# Patient Record
Sex: Male | Born: 2000 | Race: White | Hispanic: No | Marital: Single | Smoking: Never smoker
Health system: Southern US, Community
[De-identification: ages and names within clinical notes are randomized; demographics above are authoritative.]

## PROBLEM LIST (undated history)

## (undated) DIAGNOSIS — F988 Other specified behavioral and emotional disorders with onset usually occurring in childhood and adolescence: Secondary | ICD-10-CM

---
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
## 2019-03-02 ENCOUNTER — Encounter: Payer: Self-pay | Admitting: Cardiology

## 2019-03-17 ENCOUNTER — Telehealth (INDEPENDENT_AMBULATORY_CARE_PROVIDER_SITE_OTHER): Payer: 59 | Admitting: Cardiology

## 2019-03-17 DIAGNOSIS — Z8616 Personal history of COVID-19: Secondary | ICD-10-CM

## 2019-03-17 DIAGNOSIS — Z8619 Personal history of other infectious and parasitic diseases: Secondary | ICD-10-CM | POA: Diagnosis not present

## 2019-03-17 NOTE — Patient Instructions (Signed)
Medication Instructions:  Your Physician recommend you continue on your current medication as directed.    If you need a refill on your cardiac medications before your next appointment, please call your pharmacy.   Lab work: None  Testing/Procedures: None  Follow-Up: Your physician recommends that you schedule a follow-up appointment as needed with Dr. Christopher.     

## 2019-03-17 NOTE — Progress Notes (Signed)
Virtual Visit via Video Note   This visit type was conducted due to national recommendations for restrictions regarding the COVID-19 Pandemic (e.g. social distancing) in an effort to limit this patient's exposure and mitigate transmission in our community.  Due to his co-morbid illnesses, this patient is at least at moderate risk for complications without adequate follow up.  This format is felt to be most appropriate for this patient at this time.  All issues noted in this document were discussed and addressed.  A limited physical exam was performed with this format.  Please refer to the patient's chart for his consent to telehealth for [facility].   Date:  03/17/2019   ID:  Tyler Greene, DOB [birth year], MRN [id number]  Patient Location: Home Provider Location: Home  PCP:  Aletha Halim., PA-C  Cardiologist:  Buford Dresser, MD  Electrophysiologist:  None   Evaluation Performed:  Consultation - Tyler Greene was referred by Bing Matter for the evaluation of post-COVID cardiology clearance for competitive sports.  Chief Complaint:  post-COVID cardiology clearance for competitive sports  History of Present Illness:    Tyler Greene is a 18 y.o. male without significant PMH who is seen for a new consult today at the request of Bing Matter, [place] for the evaluation of cardiology clearance post COVID for competitive sports.  About a month ago, had people come over to visit. One girl had a fever earlier in the week but no known diagnosis. The next week, had a stuffy nose, which is usual for him with the change of seasons. He did think things tasted "weird" as well. Talked to his mom and trainer, did health questionnaire, recommended to get Covid test. Test came back positive. Girl who was feeling sick also ended up testing positive as well.   Never felt fevers, chest pain, shortness of breath. Nasal congestion got better with flonase and benadryl. Had a  doctor check at the time of his covid test, noted to have inflamed nasal cavity with post nasal drip, told it was likely allergies.  Has been back on campus for about a week. Running 1-1.5 miles/day without issue. Some leg tiredness but no chest pain or shortness of breath. Has been doing captain's practices in football, drills but not contact at this time.   Denies chest pain, shortness of breath at rest or with normal exertion. No PND, orthopnea, LE edema or unexpected weight gain. No syncope or palpitations. No personal history of heart disease.   Past Medical History:  Diagnosis Date  . ADD (attention deficit disorder)    No past surgical history on file.   No outpatient medications have been marked as taking for the 03/17/19 encounter (Appointment) with Buford Dresser, MD.     Allergies:   Patient has no known allergies.   Social History   Tobacco Use  . Smoking status: Not on file  Substance Use Topics  . Alcohol use: Not on file  . Drug use: Not on file     Family Hx: Father has HTN, MI 3 years ago at age 11. Mat Gma HTN. No other MI, possibly one distant relative with a stroke.  ROS:   Please see the history of present illness.    Constitutional: Negative for chills, fever, night sweats, unintentional weight loss  HENT: Negative for ear pain and hearing loss.   Eyes: Negative for loss of vision and eye pain.  Respiratory: Negative for cough, sputum, wheezing.   Cardiovascular: See HPI.  Gastrointestinal: Negative for abdominal pain, melena, and hematochezia.  Genitourinary: Negative for dysuria and hematuria.  Musculoskeletal: Negative for falls and myalgias.  Skin: Negative for itching and rash.  Neurological: Negative for focal weakness, focal sensory changes and loss of consciousness.  Endo/Heme/Allergies: Does not bruise/bleed easily.  All other systems reviewed and are negative.   Prior CV studies:   The following studies were reviewed today: none   Labs/Other Tests and Data Reviewed:    EKG:  None available  Recent Labs: No results found for requested labs within last 8760 hours.   Recent Lipid Panel No results found for: CHOL, TRIG, HDL, CHOLHDL, LDLCALC, LDLDIRECT  Wt Readings from Last 3 Encounters:  11/30/16 300 lb (136.1 kg) (>99 %, Z= 3.06)*  06/17/11 172 lb (78 kg) (>99 %, Z= 2.41)*   * Growth percentiles are based on CDC (Boys, 2-20 Years) data.     Objective:    Vital Signs:  There were no vitals taken for this visit.   GEN:  no acute distress EYES:  sclerae anicteric, EOMI - Extraocular Movements Intact RESPIRATORY:  normal respiratory effort, symmetric expansion CARDIOVASCULAR:  no peripheral edema SKIN:  no rash, lesions or ulcers. MUSCULOSKELETAL:  no obvious deformities. NEURO:  alert and oriented x 3, no obvious focal deficit PSYCH:  normal affect  ASSESSMENT & PLAN:    Cardiac evaluation for return to activity after Covid 19: Patient was essentially asymptomatic. No fevers/chills, no respiratory symptoms. Had nasal congestion that improved with allergy treatment. Had known contact exposure. Based on [facility] recommendations, he fits the following:     He has already begun to resume activity. We discussed signs/symptoms to watch for, including shortness of breath, leg swelling, and chest pain. He will contact me if any of these develop.  He can gradually return to team sports. He reports he is already doing some football practice. He is an Publishing rights manager. I recommended he pace himself and increase his exertion slowly over time. There are no specific limitations as he is beyond the 2 week window.   He does have a family history of CAD, and he should have lipids/have preventative monitoring by his PCP.  I will send a copy of this note by fax to Justin Mend, his trainer, at [phone]  COVID-19 Education: The signs and symptoms of COVID-19 were discussed with the patient and how to seek care for  testing (follow up with PCP or arrange E-visit).  The importance of social distancing was discussed today.  Time:   Today, I have spent 26 minutes with the patient with telehealth technology discussing the above problems.  Including review of records, total time 31 minutes.   Medication Adjustments/Labs and Tests Ordered: Current medicines are reviewed at length with the patient today.  Concerns regarding medicines are outlined above.   Tests Ordered: No orders of the defined types were placed in this encounter.   Medication Changes: No orders of the defined types were placed in this encounter.   Follow Up:  As needed  Signed, Jodelle Red, MD  03/17/2019 8:46 AM    [facility]

## 2019-03-18 ENCOUNTER — Encounter: Payer: Self-pay | Admitting: Cardiology

## 2019-03-18 ENCOUNTER — Telehealth: Payer: Self-pay

## 2019-03-18 NOTE — Telephone Encounter (Signed)
03/17/19 office note faxed to [facility] [phone] at the request of patient.

## 2019-07-02 ENCOUNTER — Ambulatory Visit: Payer: 59 | Attending: Internal Medicine

## 2019-07-02 ENCOUNTER — Other Ambulatory Visit: Payer: Self-pay

## 2019-07-02 DIAGNOSIS — Z20822 Contact with and (suspected) exposure to covid-19: Secondary | ICD-10-CM

## 2019-07-03 LAB — NOVEL CORONAVIRUS, NAA: SARS-CoV-2, NAA: NOT DETECTED
